# Patient Record
Sex: Female | Born: 2005
Health system: Southern US, Community
[De-identification: ages and names within clinical notes are randomized; demographics above are authoritative.]

## PROBLEM LIST (undated history)

## (undated) DIAGNOSIS — E739 Lactose intolerance, unspecified: Secondary | ICD-10-CM

## (undated) DIAGNOSIS — F419 Anxiety disorder, unspecified: Secondary | ICD-10-CM

## (undated) HISTORY — DX: Lactose intolerance, unspecified: E73.9

## (undated) HISTORY — DX: Anxiety disorder, unspecified: F41.9

---
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
## 2024-05-07 ENCOUNTER — Ambulatory Visit

## 2024-05-07 VITALS — BP 115/71 | HR 89 | Ht 66.31 in | Wt 118.1 lb

## 2024-05-07 DIAGNOSIS — Z0000 Encounter for general adult medical examination without abnormal findings: Secondary | ICD-10-CM | POA: Diagnosis not present

## 2024-05-07 DIAGNOSIS — Z23 Encounter for immunization: Secondary | ICD-10-CM | POA: Diagnosis not present

## 2024-05-07 NOTE — Assessment & Plan Note (Signed)
 No indications for labs today. Answered all patient questions. Went over and encouraged/ordered age-appropriate health screenings. Encouraged safe sex practices. 2nd meningococcal B vaccines today. Encouraged patient to aim for 150+ minutes of physical activity per week or just increase their physical activity in general in combination with a well balanced diet that prioritizes protein and fiber to support a healthy lifestyle. Will plan for next physical in 1 year, sooner PRN. Patient verbalized understanding and was in agreement with the plan.

## 2024-05-07 NOTE — Progress Notes (Signed)
 New Patient Office Visit  Subjective    Patient ID: Jill Brown, female    DOB: 10-06-2006  Age: 18 y.o. MRN: [id number]  CC:  Chief Complaint  Patient presents with   New Patient (Initial Visit)    HPI Jill Brown presents to establish care. She presents to the clinic today with her boyfriend. Was previously seen by [name] at this practice for well-child care, however has not been seen since 2023 so she is considered a new patient today. She just graduated high school in June and is starting at [employer] in August to study elementary education. No concerns to address today. Would like to get 2nd dose of meningococcal B.   PMH: Essentially negative. No daily medications.   PSH: None  FH: MI in paternal grandfather  Tobacco use: Denies use Alcohol use: Denies use Drug use: Denies use Marital status: Single Employment: Working part-time summer job. Starts [employer] in August.  Sexual hx: Sexually active with boyfriend. No birth control. Uses condoms.   Screenings:  Colon Cancer: N/A Lung Cancer: N/A Breast Cancer: N/A Diabetes: N/A HLD: N/A   Outpatient Encounter Medications as of 05/07/2024  Medication Sig   [DISCONTINUED] sertraline  (ZOLOFT ) 25 MG tablet Take 1 tablet (25 mg total) by mouth daily.   No facility-administered encounter medications on file as of 05/07/2024.    Past Medical History:  Diagnosis Date   Anxiety    Phreesia 10/24/2020   Lactose intolerance     History reviewed. No pertinent surgical history.  Family History  Problem Relation Age of Onset   Heart attack Paternal Grandfather 56    Social History   Socioeconomic History   Marital status: Single    Spouse name: Not on file   Number of children: Not on file   Years of education: Not on file   Highest education level: Not on file  Occupational History   Not on file  Tobacco Use   Smoking status: Never   Smokeless tobacco: Never  Vaping Use   Vaping status: Never Used  Substance  and Sexual Activity   Alcohol use: No   Drug use: No   Sexual activity: Never  Other Topics Concern   Not on file  Social History Narrative   Not on file   Social Drivers of Health   Financial Resource Strain: Not on file  Food Insecurity: Not on file  Transportation Needs: Not on file  Physical Activity: Not on file  Stress: Not on file  Social Connections: Unknown (08/01/2022)   [facility]   Social Network    Social Network: Not on file  Intimate Partner Violence: Unknown (08/01/2022)   Received from [facility]   HITS    Physically Hurt: Not on file    Insult or Talk Down To: Not on file    Threaten Physical Harm: Not on file    Scream or Curse: Not on file    ROS   Per HPI   Objective    BP 115/71   Pulse 89   Ht 5' 6.31 (1.684 m)   Wt 118 lb 1.9 oz (53.6 kg)   LMP 04/20/2024   SpO2 (!) 89%   BMI 18.89 kg/m   Physical Exam Constitutional:      General: She is not in acute distress.    Appearance: Normal appearance.  HENT:     Right Ear: Tympanic membrane normal.     Left Ear: Tympanic membrane normal.  Mouth/Throat:     Mouth: Mucous membranes are moist.     Pharynx: Oropharynx is clear.  Eyes:     Pupils: Pupils are equal, round, and reactive to light.  Cardiovascular:     Rate and Rhythm: Normal rate and regular rhythm.     Heart sounds: Normal heart sounds. No murmur heard.    No friction rub. No gallop.  Pulmonary:     Effort: Pulmonary effort is normal. No respiratory distress.     Breath sounds: Normal breath sounds.  Abdominal:     General: Abdomen is flat. Bowel sounds are normal.     Palpations: Abdomen is soft.  Musculoskeletal:        General: No swelling.     Cervical back: Normal range of motion.  Lymphadenopathy:     Cervical: No cervical adenopathy.  Skin:    General: Skin is warm and dry.  Neurological:     General: No focal deficit present.     Mental Status: She is alert.  Psychiatric:         Mood and Affect: Mood normal.        Behavior: Behavior normal.        Thought Content: Thought content normal.       Assessment & Plan:   Encounter for vaccination -     Meningococcal B, OMV  General medical exam Assessment & Plan: No indications for labs today. Answered all patient questions. Went over and encouraged/ordered age-appropriate health screenings. Encouraged safe sex practices. 2nd meningococcal B vaccines today. Encouraged patient to aim for 150+ minutes of physical activity per week or just increase their physical activity in general in combination with a well balanced diet that prioritizes protein and fiber to support a healthy lifestyle. Will plan for next physical in 1 year, sooner PRN. Patient verbalized understanding and was in agreement with the plan.     Return in about 1 year (around 05/07/2025) for Physical.   Saddie JULIANNA Sacks, PA-C

## 2024-05-07 NOTE — Patient Instructions (Signed)
 It was nice to see you today!  As we discussed in clinic:  -Continue eating well balanced and exercising regularly to support a healthy lifestyle -We are completing you meningococcal B vaccine series today.  -If HPV is something you would like to be vaccinated for, please schedule a vaccine appointment with our office at your convenience. -Remember that if you are sexually active and not on birth control that there is a risk of pregnancy. To avoid this, make sure you are consistently using condoms with intercourse.  -I will plan to see you back in 1 year for your next physical. It was nice to meet you!  If you have any problems before your next visit feel free to message me via MyChart (minor issues or questions) or call the office, otherwise you may reach out to schedule an office visit.  Thank you! Saddie Sacks, PA-C

## 2024-05-17 ENCOUNTER — Encounter: Admitting: Obstetrics & Gynecology

## 2024-05-29 ENCOUNTER — Encounter: Payer: Self-pay | Admitting: Certified Nurse Midwife

## 2024-05-29 ENCOUNTER — Ambulatory Visit (INDEPENDENT_AMBULATORY_CARE_PROVIDER_SITE_OTHER): Admitting: Certified Nurse Midwife

## 2024-05-29 VITALS — BP 115/74 | HR 74 | Ht 67.0 in | Wt 118.0 lb

## 2024-05-29 DIAGNOSIS — N920 Excessive and frequent menstruation with regular cycle: Secondary | ICD-10-CM

## 2024-05-29 DIAGNOSIS — Z3009 Encounter for other general counseling and advice on contraception: Secondary | ICD-10-CM

## 2024-05-29 DIAGNOSIS — Z1331 Encounter for screening for depression: Secondary | ICD-10-CM

## 2024-05-29 DIAGNOSIS — N946 Dysmenorrhea, unspecified: Secondary | ICD-10-CM | POA: Diagnosis not present

## 2024-05-29 NOTE — Progress Notes (Signed)
 New Teen GYN   LMP: 05/19/24 Monthly lasting 5-6 days Moderate to heavy flow.using super plus tampons and pad.every  45 mins (first 2 days) STD Screening: Declines Contraception: Condoms  CC: Heavy ,Painful periods.  Notes mother has hx of fibroid and had to have HYST.

## 2024-06-04 NOTE — Progress Notes (Signed)
   GYNECOLOGY OFFICE VISIT NOTE  History:   Jill Brown is a 18 y.o. G0P0000 here today to establish well woman care. She is recently a high school graduate and is planning on taking a Gap year, then applying to [employer]. She is currently sexually active with a long-term boyfriend. She states that she uses condoms as her birth control method. She states that she has normal 21-25 days cycles with a 5-7 day bleed. She does reprots heavy periods and pain 2 weeks prior to bleeding starting. She reports using super plus tampons and pads on her heavier days. Reports her periods have always been like this.  She denies any abnormal vaginal discharge, bleeding, pelvic pain or other concerns.     Past Medical History:  Diagnosis Date   Anxiety    Phreesia 10/24/2020   Lactose intolerance     History reviewed. No pertinent surgical history.  The following portions of the patient's history were reviewed and updated as appropriate: allergies, current medications, past family history, past medical history, past social history, past surgical history and problem list.   Health Maintenance:  Patient is not of age for preventative reproductive care at this time.  Review of Systems:  Pertinent items noted in HPI and remainder of comprehensive ROS otherwise negative.  Physical Exam:  BP 115/74   Pulse 74   Ht 5' 7 (1.702 m)   Wt 118 lb (53.5 kg)   LMP 04/20/2024   BMI 18.48 kg/m  CONSTITUTIONAL: Well-developed, well-nourished female in no acute distress.  HEENT:  Normocephalic, atraumatic. External right and left ear normal. No scleral icterus.  NECK: Normal range of motion, supple, no masses noted on observation SKIN: No rash noted. Not diaphoretic. No erythema. No pallor. MUSCULOSKELETAL: Normal range of motion. No edema noted. NEUROLOGIC: Alert and oriented to person, place, and time. Normal muscle tone coordination. No cranial nerve deficit noted. PSYCHIATRIC: Normal mood and affect. Normal  behavior. Normal judgment and thought content. CARDIOVASCULAR: Normal heart rate noted RESPIRATORY: Effort and breath sounds normal, no problems with respiration noted ABDOMEN: No masses noted. No other overt distention noted.   PELVIC: Deferred  Labs and Imaging No results found for this or any previous visit (from the past week). No results found.    Assessment and Plan:    1. Painful menstrual periods (Primary) - Recommended the use of NSAIDS prior to menstrual period starting.  - Also discussed dietary changes that can help with painful menstrual periods.   2. Menorrhagia with regular cycle - Denies problems with anemia d/t menstrual cycles.  - Discussed the option for Birth Control to aid in Menorrhagia and dysmenorrhea.   3. Birth control counseling - Patient is not interested in starting [facility] at this time.  - CNM agrees to patient request.    Routine preventative health maintenance measures emphasized. Please refer to After Visit Summary for other counseling recommendations.   Return in about 1 year (around 05/29/2025) for [place].    I spent 30 minutes dedicated to the care of this patient including pre-visit review of records, face to face time with the patient discussing her conditions and treatments and post visit orders.   Pamalee Marcoe Erven) Emilio, MSN, CNM  [facility]  06/04/24 5:15 AM

## 2024-07-05 ENCOUNTER — Encounter: Admitting: Family Medicine

## 2024-07-17 ENCOUNTER — Ambulatory Visit: Admitting: Podiatry

## 2024-07-17 ENCOUNTER — Ambulatory Visit (INDEPENDENT_AMBULATORY_CARE_PROVIDER_SITE_OTHER): Payer: Self-pay

## 2024-07-17 ENCOUNTER — Encounter: Payer: Self-pay | Admitting: Podiatry

## 2024-07-17 DIAGNOSIS — M67472 Ganglion, left ankle and foot: Secondary | ICD-10-CM

## 2024-07-17 DIAGNOSIS — M7752 Other enthesopathy of left foot: Secondary | ICD-10-CM

## 2024-07-17 NOTE — Progress Notes (Signed)
   Chief Complaint  Patient presents with   Foot Pain    Pt is here due to left foot pain, states there is a knot on the side of the foot, notice it in August and has grew in size since and is causing some pain.    HPI: 18 y.o. female presenting today as a new patient with her mother for evaluation of a lesion that developed to the left foot.  Idiopathic onset which began around August.  Past Medical History:  Diagnosis Date   Anxiety    Phreesia 10/24/2020   Lactose intolerance     No past surgical history on file.  Allergies  Allergen Reactions   Lactose Intolerance (Gi)      Physical Exam: General: The patient is alert and oriented x3 in no acute distress.  Dermatology: Skin is warm, dry and supple bilateral lower extremities.   Vascular: Palpable pedal pulses bilaterally. Capillary refill within normal limits.  No appreciable edema.  No erythema.  Neurological: Grossly intact via light touch  Musculoskeletal Exam: No pedal deformities noted.  Ganglion cyst noted to the dorsum of the left foot overlying the EDL tendon extending to the fifth toe  Radiographic Exam LT foot 07/17/2024:  Normal osseous mineralization. Joint spaces preserved.  No fractures or osseous irregularities noted.  Impression: Negative  Assessment/Plan of Care: 1.  Ganglion cyst left foot  -Patient evaluated.  X-rays reviewed -Quick pressure was applied to the ganglion cyst and it was ruptured and resorbed into the surrounding tissue.  Instant relief -Recommend daily massage to the area to prevent recurrence -Return to clinic PRN      Thresa EMERSON Sar, DPM [facility]  Dr. Thresa EMERSON Sar, DPM    [address] [place], [place] [address]                Office [phone]  Fax [phone]

## 2024-08-31 ENCOUNTER — Other Ambulatory Visit: Payer: Self-pay

## 2024-08-31 ENCOUNTER — Encounter: Payer: Self-pay | Admitting: Emergency Medicine

## 2024-08-31 ENCOUNTER — Ambulatory Visit
Admission: EM | Admit: 2024-08-31 | Discharge: 2024-08-31 | Disposition: A | Discharge status: Home / Self Care | Attending: Internal Medicine | Admitting: Internal Medicine

## 2024-08-31 DIAGNOSIS — J029 Acute pharyngitis, unspecified: Secondary | ICD-10-CM | POA: Insufficient documentation

## 2024-08-31 DIAGNOSIS — R0981 Nasal congestion: Secondary | ICD-10-CM | POA: Diagnosis present

## 2024-08-31 DIAGNOSIS — R52 Pain, unspecified: Secondary | ICD-10-CM | POA: Insufficient documentation

## 2024-08-31 LAB — POC COVID19/FLU A&B COMBO
Covid Antigen, POC: NEGATIVE
Influenza A Antigen, POC: NEGATIVE
Influenza B Antigen, POC: NEGATIVE

## 2024-08-31 LAB — POCT RAPID STREP A (OFFICE): Rapid Strep A Screen: NEGATIVE

## 2024-08-31 MED ORDER — PREDNISONE 20 MG PO TABS: 40.0000 mg | ORAL_TABLET | Freq: Every day | ORAL | 0 refills | Status: AC

## 2024-08-31 NOTE — ED Triage Notes (Signed)
 Pt here for sorethroat and fever yesterday with some chills; pt sts hx of strep in past

## 2024-08-31 NOTE — ED Provider Notes (Signed)
 [facility]    CSN: [id number] Arrival date & time: 08/31/24  1209      History   Chief Complaint Chief Complaint  Patient presents with   Sore Throat    HPI Jill Brown is a 18 y.o. female.    Sore Throat Pertinent negatives include no chest pain, no abdominal pain and no shortness of breath.    Past Medical History:  Diagnosis Date   Anxiety    Phreesia 10/24/2020   Lactose intolerance     Patient Active Problem List   Diagnosis Date Noted   GAD (generalized anxiety disorder) 11/28/2017   ETD (Eustachian tube dysfunction) 08/25/2016   paroxysmal positional vertigo 08/25/2016   Lactose intolerance 08/04/2016   General medical exam 08/04/2016    History reviewed. No pertinent surgical history.  OB History     Gravida  0   Para  0   Term  0   Preterm  0   AB  0   Living  0      SAB  0   IAB  0   Ectopic  0   Multiple  0   Live Births  0            Home Medications    Prior to Admission medications   Medication Sig Start Date End Date Taking? Authorizing Provider  amoxicillin (AMOXIL) 500 MG capsule Take 500 mg by mouth 3 (three) times daily. 06/17/24   [provider]  HYDROcodone-acetaminophen (NORCO) 10-325 MG tablet Take 1 tablet by mouth every 6 (six) hours as needed. 06/17/24   [provider]    Family History Family History  Problem Relation Age of Onset   Heart attack Paternal Grandfather 36    Social History Social History   Tobacco Use   Smoking status: Never   Smokeless tobacco: Never  Vaping Use   Vaping status: Never Used  Substance Use Topics   Alcohol use: No   Drug use: No     Allergies   Lactose intolerance (gi)   Review of Systems Review of Systems  Constitutional:  Negative for chills and fever.  HENT:  Positive for sore throat. Negative for ear pain.   Eyes:  Negative for pain and visual disturbance.  Respiratory:  Negative for cough and shortness of breath.    Cardiovascular:  Negative for chest pain and palpitations.  Gastrointestinal:  Negative for abdominal pain and vomiting.  Genitourinary:  Negative for dysuria and hematuria.  Musculoskeletal:  Negative for arthralgias and back pain.  Skin:  Negative for color change and rash.  Neurological:  Negative for seizures and syncope.  All other systems reviewed and are negative.    Physical Exam Triage Vital Signs ED Triage Vitals  Encounter Vitals Group     BP 08/31/24 1242 101/76     Girls Systolic BP Percentile --      Girls Diastolic BP Percentile --      Boys Systolic BP Percentile --      Boys Diastolic BP Percentile --      Pulse Rate 08/31/24 1242 99     Resp 08/31/24 1242 18     Temp 08/31/24 1242 98.6 F (37 C)     Temp Source 08/31/24 1242 Oral     SpO2 08/31/24 1242 99 %     Weight --      Height --      Head Circumference --      Peak Flow --  Pain Score 08/31/24 1243 5     Pain Loc --      Pain Education --      Exclude from Growth Chart --    No data found.  Updated Vital Signs BP 101/76 (BP Location: Left Arm)   Pulse 99   Temp 98.6 F (37 C) (Oral)   Resp 18   SpO2 99%   Visual Acuity Right Eye Distance:   Left Eye Distance:   Bilateral Distance:    Right Eye Near:   Left Eye Near:    Bilateral Near:     Physical Exam Vitals and nursing note reviewed.  Constitutional:      General: She is not in acute distress.    Appearance: She is well-developed.  HENT:     Head: Normocephalic and atraumatic.  Eyes:     Conjunctiva/sclera: Conjunctivae normal.  Cardiovascular:     Rate and Rhythm: Normal rate and regular rhythm.     Heart sounds: No murmur heard. Pulmonary:     Effort: Pulmonary effort is normal. No respiratory distress.     Breath sounds: Normal breath sounds.  Abdominal:     Palpations: Abdomen is soft.     Tenderness: There is no abdominal tenderness.  Musculoskeletal:        General: No swelling.     Cervical back: Neck  supple.  Skin:    General: Skin is warm and dry.     Capillary Refill: Capillary refill takes less than 2 seconds.  Neurological:     Mental Status: She is alert.  Psychiatric:        Mood and Affect: Mood normal.      UC Treatments / Results  Labs (all labs ordered are listed, but only abnormal results are displayed) Labs Reviewed  POCT RAPID STREP A (OFFICE) - Normal  POC COVID19/FLU A&B COMBO - Normal  CULTURE, GROUP A STREP [facility])    EKG   Radiology No results found.  Procedures Procedures (including critical care time)  Medications Ordered in UC Medications - No data to display  Initial Impression / Assessment and Plan / UC Course  I have reviewed the triage vital signs and the nursing notes.  Pertinent labs & imaging results that were available during my care of the patient were reviewed by me and considered in my medical decision making (see chart for details).     Sore throat - Plan: POC Covid19/Flu A&B Antigen, POC Covid19/Flu A&B Antigen, Culture, group A strep, Culture, group A strep  Nasal congestion - Plan: POC Covid19/Flu A&B Antigen, POC Covid19/Flu A&B Antigen  Body aches - Plan: POC Covid19/Flu A&B Antigen, POC Covid19/Flu A&B Antigen   Strep testing was negative.  Due to the symptoms and history of strep we will send this off for a culture and if this culture shows any evidence of bacterial growth, we will contact you to start antibiotics.  Flu A, flu B and COVID are negative.  Symptoms, physical exam findings and duration do suggest more of a viral illness versus  allergies.  We will treat for symptom relief but do not feel that antibiotics are indicated for now.  We will treat with the following: Prednisone  40 mg (2 tablets) once daily for 3 days. Take this in the morning.  This is a steroid to help with inflammation and pain.  Recommend starting Claritin or Allegra once daily for a week Can try over-the-counter Flonase  to help with congestion Do  not take ibuprofen while  you are taking prednisone  but you can take Tylenol Make sure to stay hydrated by drinking plenty of water.  If your symptoms do not improve in the next 3 to 4 days or your symptoms worsen significantly then recommend returning to urgent care for further evaluation  Final Clinical Impressions(s) / UC Diagnoses   Final diagnoses:  Sore throat  Nasal congestion  Body aches     Discharge Instructions      Strep testing was negative.  Due to the symptoms and history of strep we will send this off for a culture and if this culture shows any evidence of bacterial growth, we will contact you to start antibiotics.  Flu A, flu B and COVID are negative.  Symptoms, physical exam findings and duration do suggest more of a viral illness versus  allergies.  We will treat for symptom relief but do not feel that antibiotics are indicated for now.  We will treat with the following: Prednisone  40 mg (2 tablets) once daily for 3 days. Take this in the morning.  This is a steroid to help with inflammation and pain.  Recommend starting Claritin or Allegra once daily for a week Can try over-the-counter Flonase  to help with congestion Do not take ibuprofen while you are taking prednisone  but you can take Tylenol Make sure to stay hydrated by drinking plenty of water.  If your symptoms do not improve in the next 3 to 4 days or your symptoms worsen significantly then recommend returning to urgent care for further evaluation     ED Prescriptions   None    PDMP not reviewed this encounter.   Teresa Almarie LABOR, PA-C 08/31/24 1347

## 2024-08-31 NOTE — Discharge Instructions (Addendum)
 Strep testing was negative.  Due to the symptoms and history of strep we will send this off for a culture and if this culture shows any evidence of bacterial growth, we will contact you to start antibiotics.  Flu A, flu B and COVID are negative.  Symptoms, physical exam findings and duration do suggest more of a viral illness versus  allergies.  We will treat for symptom relief but do not feel that antibiotics are indicated for now.  We will treat with the following: Prednisone  40 mg (2 tablets) once daily for 3 days. Take this in the morning.  This is a steroid to help with inflammation and pain.  Recommend starting Claritin or Allegra once daily for a week Can try over-the-counter Flonase  to help with congestion Do not take ibuprofen while you are taking prednisone  but you can take Tylenol Make sure to stay hydrated by drinking plenty of water.  If your symptoms do not improve in the next 3 to 4 days or your symptoms worsen significantly then recommend returning to urgent care for further evaluation

## 2024-09-06 LAB — CULTURE, GROUP A STREP (THRC)

## 2025-05-08 ENCOUNTER — Encounter
